# Patient Record
Sex: Female | Born: 1945 | Race: White | Hispanic: No
Health system: Southern US, Community
[De-identification: ages and names within clinical notes are randomized; demographics above are authoritative.]

## PROBLEM LIST (undated history)

## (undated) DIAGNOSIS — E119 Type 2 diabetes mellitus without complications: Secondary | ICD-10-CM

## (undated) HISTORY — PX: BREAST SURGERY: SHX581

## (undated) HISTORY — PX: GALLBLADDER SURGERY: SHX652

---

## 2000-08-21 ENCOUNTER — Encounter: Payer: Self-pay | Admitting: Family Medicine

## 2000-08-21 ENCOUNTER — Encounter: Admission: RE | Admit: 2000-08-21 | Discharge: 2000-08-21 | Payer: Self-pay | Admitting: Family Medicine

## 2005-07-09 ENCOUNTER — Ambulatory Visit (HOSPITAL_COMMUNITY): Admission: RE | Admit: 2005-07-09 | Discharge: 2005-07-09 | Payer: Self-pay | Admitting: Chiropractic Medicine

## 2006-05-03 ENCOUNTER — Encounter: Admission: RE | Admit: 2006-05-03 | Discharge: 2006-05-03 | Payer: Self-pay | Admitting: Family Medicine

## 2010-06-11 ENCOUNTER — Encounter: Payer: Self-pay | Admitting: Family Medicine

## 2011-07-24 ENCOUNTER — Other Ambulatory Visit: Payer: Self-pay | Admitting: Family Medicine

## 2011-07-24 DIAGNOSIS — M8580 Other specified disorders of bone density and structure, unspecified site: Secondary | ICD-10-CM

## 2011-07-31 ENCOUNTER — Ambulatory Visit
Admission: RE | Admit: 2011-07-31 | Discharge: 2011-07-31 | Disposition: A | Discharge status: Home / Self Care | Payer: Medicare Other | Source: Ambulatory Visit | Attending: Family Medicine | Admitting: Family Medicine

## 2011-07-31 DIAGNOSIS — M8580 Other specified disorders of bone density and structure, unspecified site: Secondary | ICD-10-CM

## 2013-05-12 ENCOUNTER — Other Ambulatory Visit: Payer: Self-pay | Admitting: Internal Medicine

## 2013-05-12 DIAGNOSIS — M8580 Other specified disorders of bone density and structure, unspecified site: Secondary | ICD-10-CM

## 2013-06-05 ENCOUNTER — Ambulatory Visit
Admission: RE | Admit: 2013-06-05 | Discharge: 2013-06-05 | Disposition: A | Discharge status: Home / Self Care | Payer: Medicare Other | Source: Ambulatory Visit | Attending: Internal Medicine | Admitting: Internal Medicine

## 2013-06-05 ENCOUNTER — Other Ambulatory Visit: Payer: Self-pay | Admitting: Family Medicine

## 2013-06-05 DIAGNOSIS — M8580 Other specified disorders of bone density and structure, unspecified site: Secondary | ICD-10-CM

## 2016-08-13 ENCOUNTER — Other Ambulatory Visit: Payer: Self-pay | Admitting: Family Medicine

## 2016-08-13 DIAGNOSIS — M810 Age-related osteoporosis without current pathological fracture: Secondary | ICD-10-CM

## 2016-09-03 ENCOUNTER — Ambulatory Visit
Admission: RE | Admit: 2016-09-03 | Discharge: 2016-09-03 | Disposition: A | Discharge status: Home / Self Care | Payer: Medicare Other | Source: Ambulatory Visit | Attending: Family Medicine | Admitting: Family Medicine

## 2016-09-03 DIAGNOSIS — M810 Age-related osteoporosis without current pathological fracture: Secondary | ICD-10-CM

## 2020-11-15 ENCOUNTER — Other Ambulatory Visit: Payer: Self-pay | Admitting: Family Medicine

## 2020-11-15 DIAGNOSIS — Z13820 Encounter for screening for osteoporosis: Secondary | ICD-10-CM

## 2020-11-17 ENCOUNTER — Other Ambulatory Visit: Payer: Self-pay | Admitting: Family Medicine

## 2020-11-17 DIAGNOSIS — M810 Age-related osteoporosis without current pathological fracture: Secondary | ICD-10-CM

## 2020-12-05 LAB — COLOGUARD: COLOGUARD: NEGATIVE

## 2020-12-05 LAB — EXTERNAL GENERIC LAB PROCEDURE: COLOGUARD: NEGATIVE

## 2021-05-02 ENCOUNTER — Ambulatory Visit
Admission: RE | Admit: 2021-05-02 | Discharge: 2021-05-02 | Disposition: A | Discharge status: Home / Self Care | Payer: Medicare Other | Source: Ambulatory Visit | Attending: Family Medicine | Admitting: Family Medicine

## 2021-05-02 DIAGNOSIS — M810 Age-related osteoporosis without current pathological fracture: Secondary | ICD-10-CM

## 2021-05-11 ENCOUNTER — Other Ambulatory Visit (HOSPITAL_BASED_OUTPATIENT_CLINIC_OR_DEPARTMENT_OTHER): Payer: Self-pay | Admitting: Family Medicine

## 2021-05-11 DIAGNOSIS — H539 Unspecified visual disturbance: Secondary | ICD-10-CM

## 2021-05-12 ENCOUNTER — Ambulatory Visit (HOSPITAL_BASED_OUTPATIENT_CLINIC_OR_DEPARTMENT_OTHER): Payer: Medicare Other

## 2021-05-16 ENCOUNTER — Ambulatory Visit (HOSPITAL_BASED_OUTPATIENT_CLINIC_OR_DEPARTMENT_OTHER)
Admission: RE | Admit: 2021-05-16 | Discharge: 2021-05-16 | Disposition: A | Discharge status: Home / Self Care | Payer: Medicare Other | Source: Ambulatory Visit | Attending: Family Medicine | Admitting: Family Medicine

## 2021-05-16 ENCOUNTER — Other Ambulatory Visit: Payer: Self-pay

## 2021-05-16 DIAGNOSIS — H539 Unspecified visual disturbance: Secondary | ICD-10-CM | POA: Insufficient documentation

## 2021-05-16 IMAGING — US US CAROTID DUPLEX BILAT
1 series · 13 of 24 positions shown · non-contrast
Comparison: None.

CLINICAL DATA: 75-year-old female with history of visual changes.

EXAM:
BILATERAL CAROTID DUPLEX ULTRASOUND
TECHNIQUE: Gray scale imaging, color Doppler and duplex ultrasound were
performed of bilateral carotid and vertebral arteries in the neck.

[Series 1: us carotid bilateral · 13 of 54 slices shown]
[im 1/54]
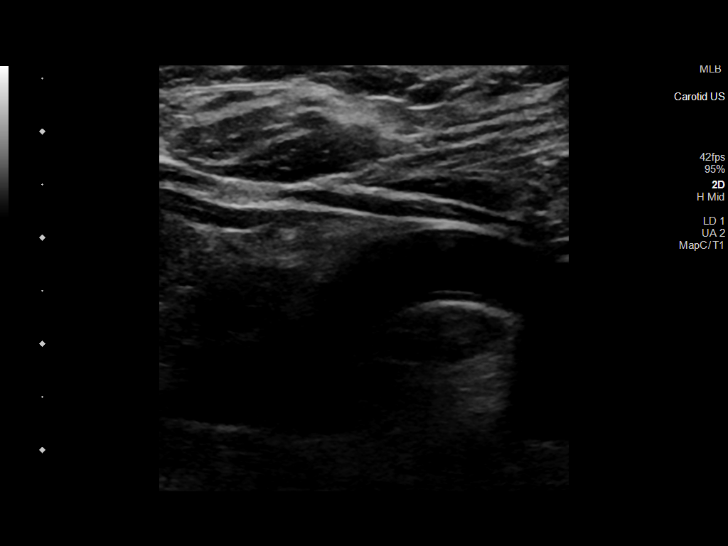
[im 5/54]
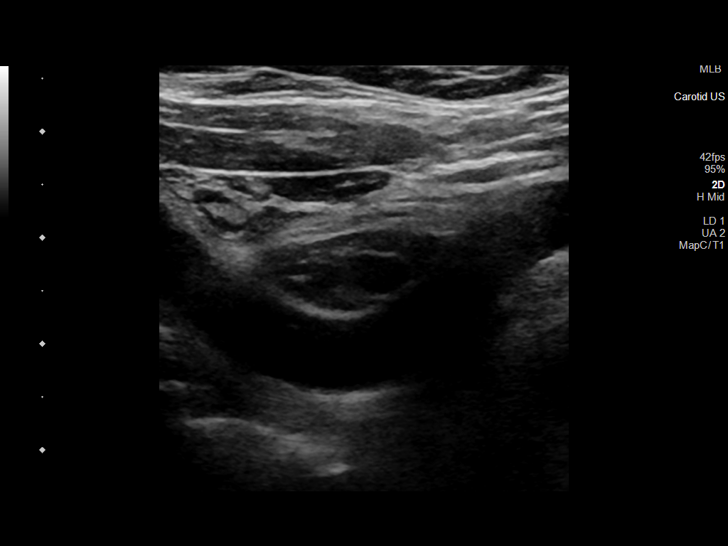
[im 10/54]
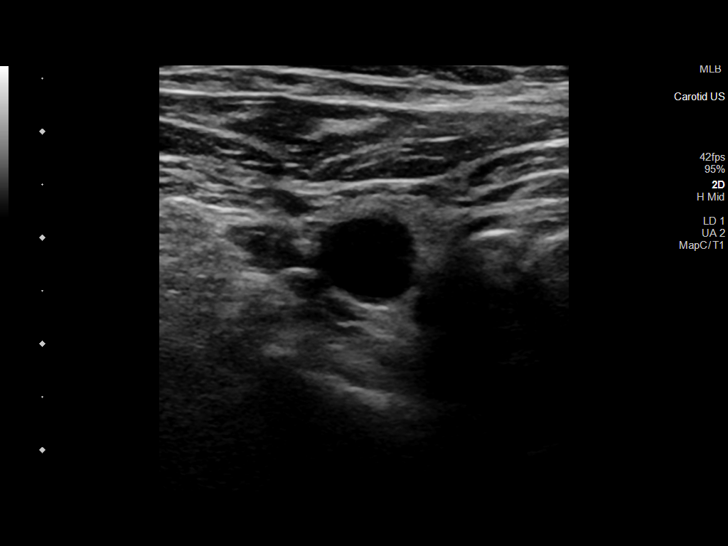
[im 14/54]
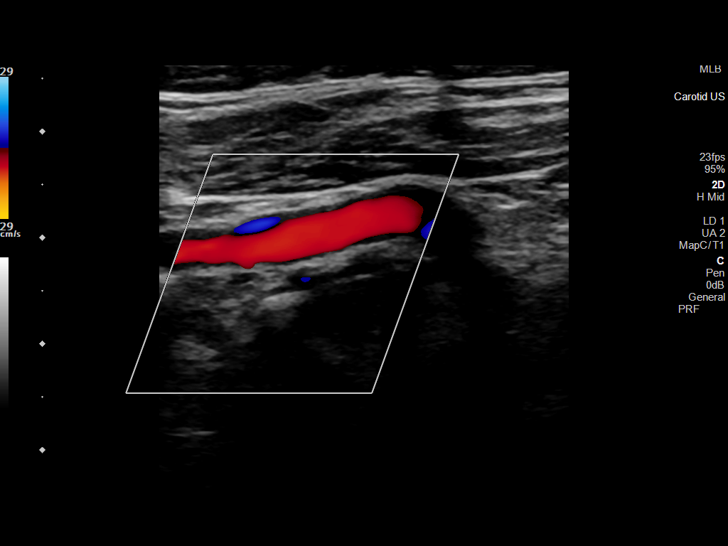
[im 19/54]
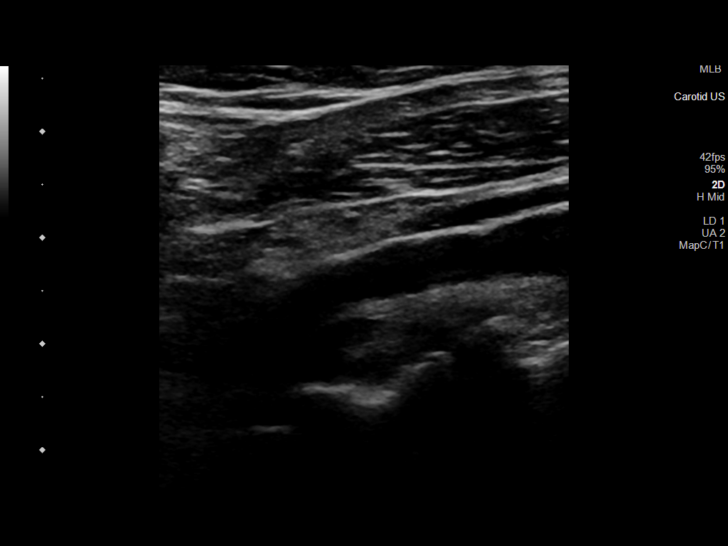
[im 24/54]
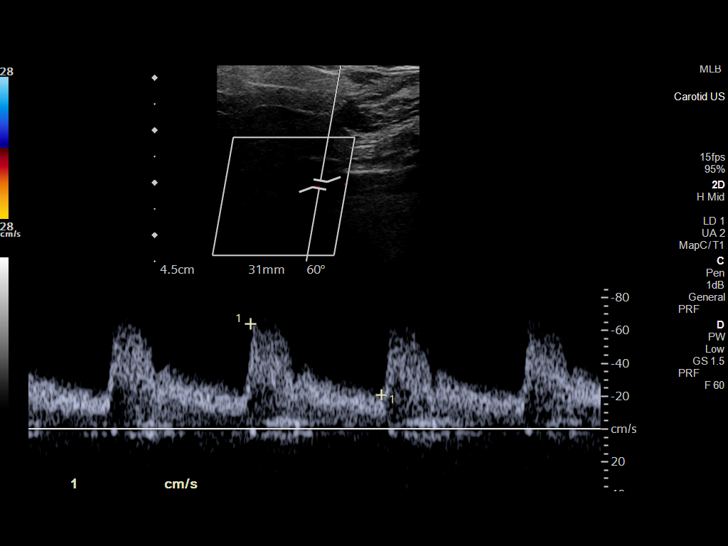
[im 28/54]
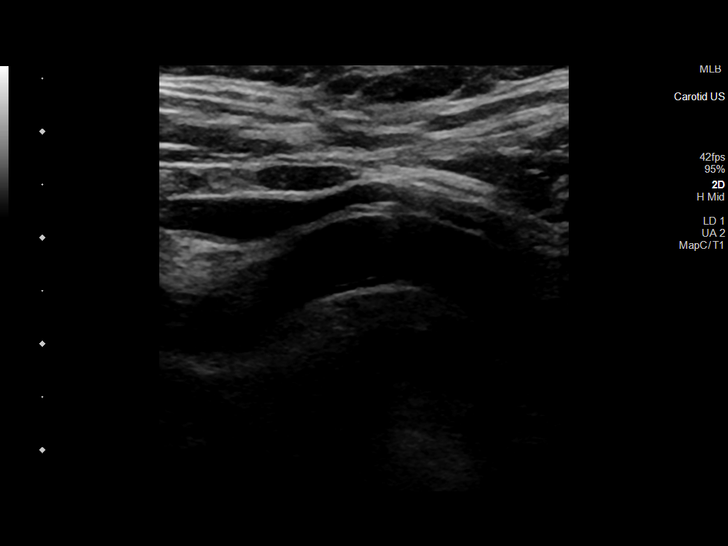
[im 30/54]
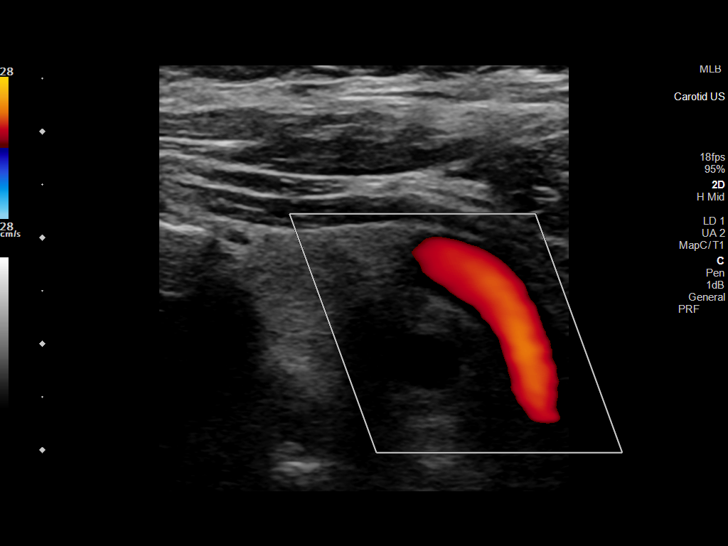
[im 35/54]
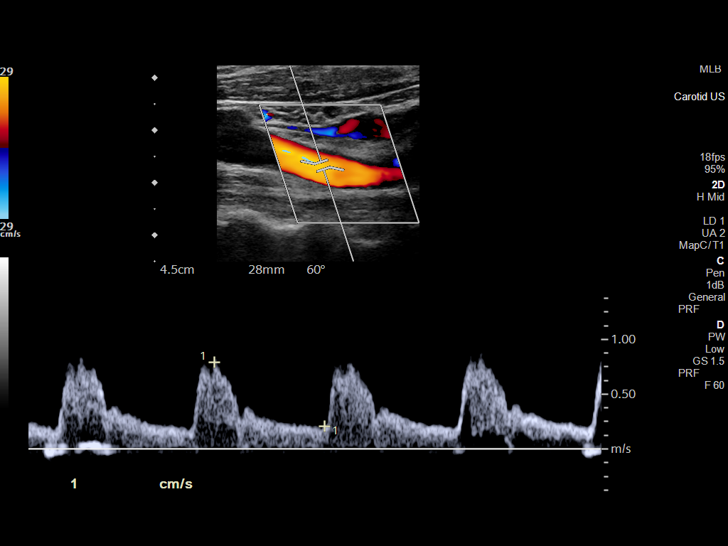
[im 40/54]
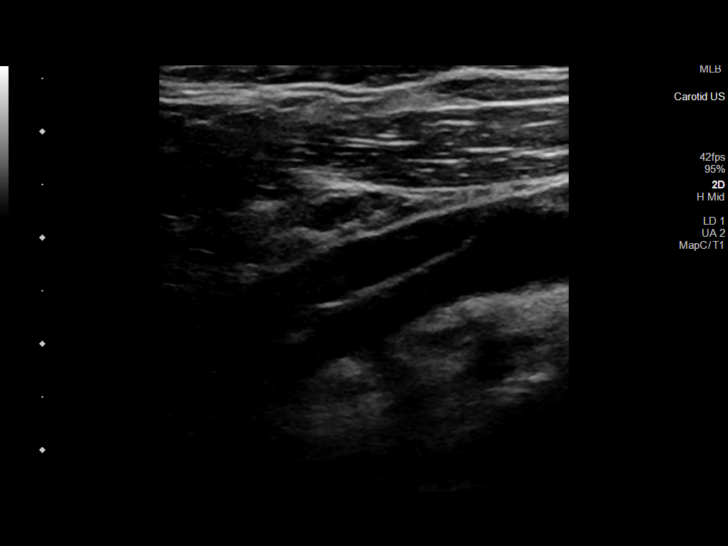
[im 44/54]
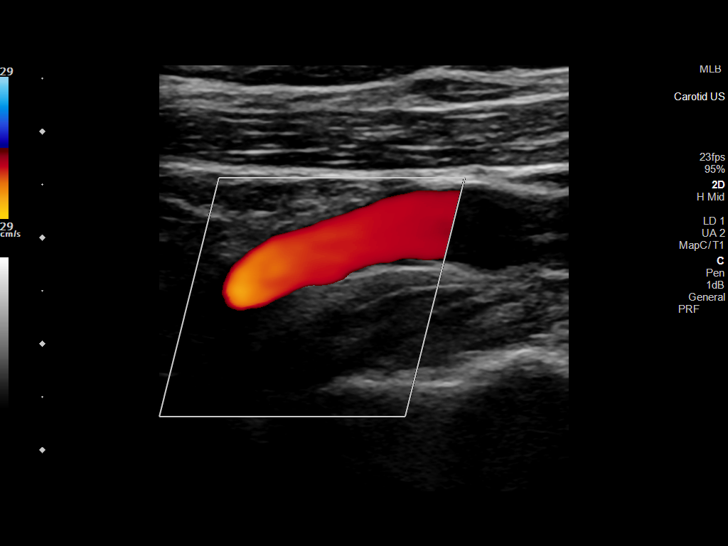
[im 49/54]
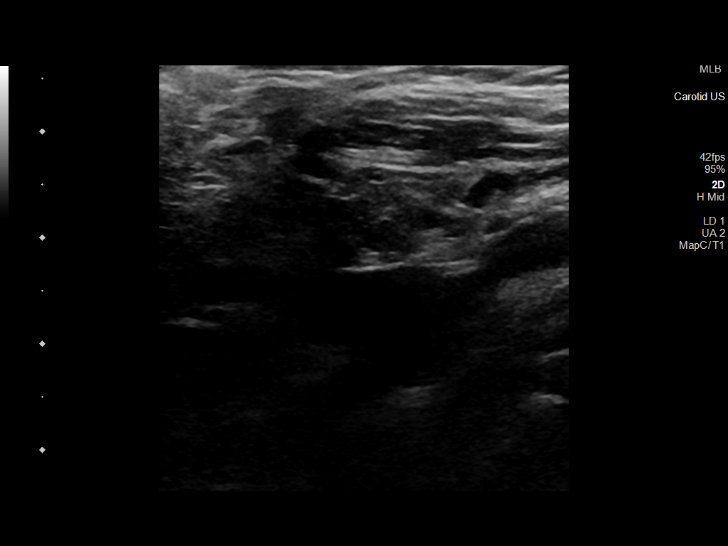
[im 54/54]
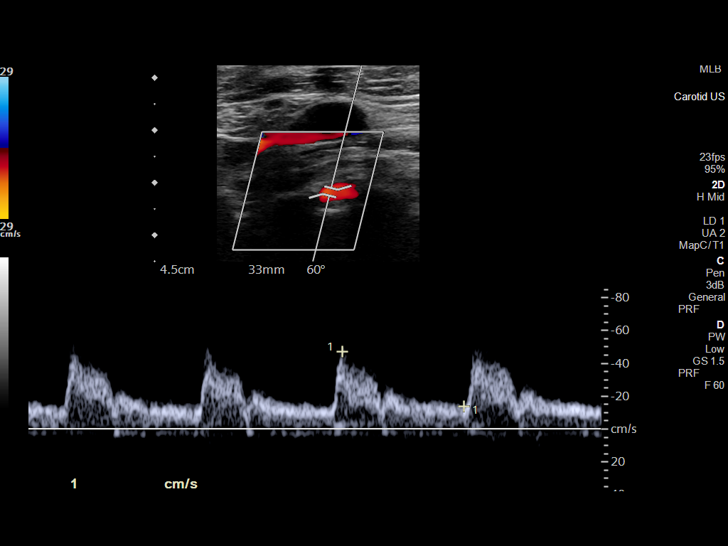

[13 of 24 positions shown; findings below may reference images not displayed]

FINDINGS: Criteria: Quantification of carotid stenosis is based on velocity
parameters that correlate the residual internal carotid diameter
with NASCET-based stenosis levels, using the diameter of the distal
internal carotid lumen as the denominator for stenosis measurement.

The following velocity measurements were obtained:

RIGHT

ICA: Peak systolic velocity 70 cm/sec, End diastolic velocity 23
cm/sec

CCA: Peak systolic velocity 85 cm/sec

SYSTOLIC ICA/CCA RATIO:

ECA: Peak systolic velocity 94 cm/sec

LEFT

ICA: Peak systolic velocity 71 cm/sec, End diastolic velocity 24
cm/sec

CCA: 79 cm/sec

SYSTOLIC ICA/CCA RATIO:

ECA: 85 cm/sec

RIGHT CAROTID ARTERY: No atherosclerotic plaque formation. No
significant tortuosity. Normal low resistance waveforms.

RIGHT VERTEBRAL ARTERY:  Antegrade flow.

LEFT CAROTID ARTERY: No atherosclerotic plaque formation. No
significant tortuosity. Normal low resistance waveforms.

LEFT VERTEBRAL ARTERY:  Antegrade flow.

Upper extremity non-invasive blood pressures:

Not obtained.
IMPRESSION: 1. Right carotid artery system: Patent without significant
atherosclerotic plaque formation.

2. Left carotid artery system: Patent without significant
atherosclerotic plaque formation.

3.  Vertebral artery system: Patent with antegrade flow bilaterally.

## 2021-05-25 ENCOUNTER — Other Ambulatory Visit: Payer: Self-pay | Admitting: Family Medicine

## 2021-05-25 DIAGNOSIS — H547 Unspecified visual loss: Secondary | ICD-10-CM

## 2021-05-25 DIAGNOSIS — H5460 Unqualified visual loss, one eye, unspecified: Secondary | ICD-10-CM

## 2021-05-25 DIAGNOSIS — H539 Unspecified visual disturbance: Secondary | ICD-10-CM

## 2021-06-20 ENCOUNTER — Ambulatory Visit
Admission: RE | Admit: 2021-06-20 | Discharge: 2021-06-20 | Disposition: A | Discharge status: Home / Self Care | Payer: Medicare Other | Source: Ambulatory Visit | Attending: Family Medicine | Admitting: Family Medicine

## 2021-06-20 ENCOUNTER — Other Ambulatory Visit: Payer: Self-pay

## 2021-06-20 DIAGNOSIS — H539 Unspecified visual disturbance: Secondary | ICD-10-CM

## 2021-06-20 DIAGNOSIS — H5460 Unqualified visual loss, one eye, unspecified: Secondary | ICD-10-CM

## 2021-06-20 DIAGNOSIS — H547 Unspecified visual loss: Secondary | ICD-10-CM

## 2021-08-22 ENCOUNTER — Other Ambulatory Visit: Payer: Self-pay | Admitting: Family Medicine

## 2021-08-22 DIAGNOSIS — M5459 Other low back pain: Secondary | ICD-10-CM

## 2021-08-25 ENCOUNTER — Other Ambulatory Visit (HOSPITAL_BASED_OUTPATIENT_CLINIC_OR_DEPARTMENT_OTHER): Payer: Self-pay | Admitting: Emergency Medicine

## 2021-08-25 ENCOUNTER — Ambulatory Visit (HOSPITAL_BASED_OUTPATIENT_CLINIC_OR_DEPARTMENT_OTHER)
Admission: RE | Admit: 2021-08-25 | Discharge: 2021-08-25 | Disposition: A | Discharge status: Home / Self Care | Payer: Medicare Other | Source: Ambulatory Visit | Attending: Emergency Medicine | Admitting: Emergency Medicine

## 2021-08-25 DIAGNOSIS — M4317 Spondylolisthesis, lumbosacral region: Secondary | ICD-10-CM | POA: Insufficient documentation

## 2021-08-25 DIAGNOSIS — M48061 Spinal stenosis, lumbar region without neurogenic claudication: Secondary | ICD-10-CM | POA: Diagnosis not present

## 2021-08-25 DIAGNOSIS — M545 Low back pain, unspecified: Secondary | ICD-10-CM

## 2021-08-25 DIAGNOSIS — M5136 Other intervertebral disc degeneration, lumbar region: Secondary | ICD-10-CM | POA: Insufficient documentation

## 2021-08-25 DIAGNOSIS — M4316 Spondylolisthesis, lumbar region: Secondary | ICD-10-CM | POA: Insufficient documentation

## 2021-08-28 ENCOUNTER — Other Ambulatory Visit: Payer: Self-pay | Admitting: Family Medicine

## 2021-08-28 DIAGNOSIS — M545 Low back pain, unspecified: Secondary | ICD-10-CM

## 2021-08-30 ENCOUNTER — Other Ambulatory Visit: Payer: Self-pay | Admitting: Family Medicine

## 2021-08-30 ENCOUNTER — Ambulatory Visit
Admission: RE | Admit: 2021-08-30 | Discharge: 2021-08-30 | Disposition: A | Discharge status: Home / Self Care | Payer: Medicare Other | Source: Ambulatory Visit | Attending: Family Medicine | Admitting: Family Medicine

## 2021-08-30 DIAGNOSIS — G8929 Other chronic pain: Secondary | ICD-10-CM

## 2021-08-30 MED ORDER — IOPAMIDOL (ISOVUE-M 200) INJECTION 41%
1.0000 mL | Freq: Once | INTRAMUSCULAR | Status: AC
  Administered 2021-08-30: 1 mL via EPIDURAL

## 2021-08-30 MED ORDER — METHYLPREDNISOLONE ACETATE 40 MG/ML INJ SUSP (RADIOLOG
80.0000 mg | Freq: Once | INTRAMUSCULAR | Status: AC
  Administered 2021-08-30: 80 mg via EPIDURAL

## 2021-08-30 NOTE — Discharge Instructions (Signed)

## 2021-09-02 ENCOUNTER — Other Ambulatory Visit: Payer: Medicare Other

## 2023-03-04 ENCOUNTER — Other Ambulatory Visit: Payer: Self-pay | Admitting: Family Medicine

## 2023-03-04 DIAGNOSIS — M8580 Other specified disorders of bone density and structure, unspecified site: Secondary | ICD-10-CM

## 2024-06-02 ENCOUNTER — Other Ambulatory Visit: Payer: Self-pay

## 2024-06-02 ENCOUNTER — Other Ambulatory Visit: Payer: Self-pay | Admitting: Orthopedic Surgery

## 2024-06-02 ENCOUNTER — Encounter (HOSPITAL_BASED_OUTPATIENT_CLINIC_OR_DEPARTMENT_OTHER): Payer: Self-pay | Admitting: Orthopedic Surgery

## 2024-06-04 ENCOUNTER — Ambulatory Visit (HOSPITAL_BASED_OUTPATIENT_CLINIC_OR_DEPARTMENT_OTHER): Admission: RE | Admit: 2024-06-04 | Admitting: Orthopedic Surgery

## 2024-06-04 ENCOUNTER — Encounter (HOSPITAL_BASED_OUTPATIENT_CLINIC_OR_DEPARTMENT_OTHER): Admission: RE | Payer: Self-pay

## 2024-06-04 DIAGNOSIS — Z01818 Encounter for other preprocedural examination: Secondary | ICD-10-CM

## 2024-06-04 HISTORY — DX: Type 2 diabetes mellitus without complications: E11.9

## 2024-06-04 SURGERY — CARPAL TUNNEL RELEASE
Anesthesia: Regional | Site: Wrist | Laterality: Right

## 2024-06-25 NOTE — Progress Notes (Unsigned)
 "  Referring Physician:  Debrah Gay ORN., PA-C [address]  [place] [address]  Primary Physician:  Jessica Gay ORN., PA-C  History of Present Illness: 06/25/2024 [name] is here today with a chief complaint of ***  Right Hand - Numbness, Pain  Most of her pain is in her thumb and middle finger.  She complains that it hurts even when she isnt doing anything and keeps her up at night.   EMG- 09/19/2022  Duration: *** Location: *** Quality: *** Severity: ***  Precipitating: aggravated by *** Modifying factors: made better by *** Weakness: none Timing: *** Bowel/Bladder Dysfunction: none  Conservative measures:  Physical therapy: *** Has not participated in? Multimodal medical therapy including regular antiinflammatories: *** ? Injections: *** epidural steroid injections?  Past Surgery: ***  Jessica Gay has ***no symptoms of cervical myelopathy.  The symptoms are causing a significant impact on the patient's life.   Review of Systems:  A 10 point review of systems is negative, except for the pertinent positives and negatives detailed in the HPI.  Past Medical History: Past Medical History:  Diagnosis Date   Diabetes mellitus without complication (HCC)    no meds    Past Surgical History: Past Surgical History:  Procedure Laterality Date   BREAST SURGERY     GALLBLADDER SURGERY      Allergies: Allergies as of 07/07/2024 - Review Complete 06/02/2024  Allergen Reaction Noted   Keflex [cephalexin] Other (See Comments) 06/02/2024    Medications: No outpatient encounter medications on file as of 07/07/2024.   No facility-administered encounter medications on file as of 07/07/2024.    Social History: Social History[1]  Family Medical History: No family history on file.  Physical Examination: @VITALWITHPAIN @  General: Patient is well developed, well nourished, calm, collected, and in no apparent distress. Attention to examination  is appropriate.  Psychiatric: Patient is non-anxious.  Head:  Pupils equal, round, and reactive to light.  ENT:  Oral mucosa appears well hydrated.  Neck:   Supple.  ***Full range of motion.  Respiratory: Patient is breathing without any difficulty.  Extremities: No edema.  Vascular: Palpable dorsal pedal pulses.  Skin:   On exposed skin, there are no abnormal skin lesions.  NEUROLOGICAL:     Awake, alert, oriented to person, place, and time.  Speech is clear and fluent. Fund of knowledge is appropriate.   Cranial Nerves: Pupils equal round and reactive to light.  Facial tone is symmetric.  Facial sensation is symmetric.  ROM of spine: ***full.  Palpation of spine: ***non tender.    Strength: Side Biceps Triceps Deltoid Interossei Grip Wrist Ext. Wrist Flex.  R 5 5 5 5 5 5 5   L 5 5 5 5 5 5 5    Side Iliopsoas Quads Hamstring PF DF EHL  R 5 5 5 5 5 5   L 5 5 5 5 5 5    Reflexes are ***2+ and symmetric at the biceps, triceps, brachioradialis, patella and achilles.   Hoffman's is absent.  Clonus is not present.  Toes are down-going.  Bilateral upper and lower extremity sensation is intact to light touch.    Gait is normal.   No difficulty with tandem gait.   No evidence of dysmetria noted.  Medical Decision Making  Imaging: ***  I have personally reviewed the images and agree with the above interpretation.  Assessment and Plan: [name] is a pleasant 79 y.o. female with ***    Thank you for  involving me in the care of this patient.   I spent a total of *** minutes in both face-to-face and non-face-to-face activities for this visit on the date of this encounter.   Lyle Decamp, PA-C Dept. of Neurosurgery     [1]  Social History Tobacco Use   Smoking status: Former    Types: Cigarettes   Smokeless tobacco: Never  Substance Use Topics   Alcohol use: Not Currently   Drug use: Never   "

## 2024-07-07 ENCOUNTER — Ambulatory Visit: Admitting: Physician Assistant
